# Patient Record
Sex: Female | Born: 1990 | Race: Black or African American | Hispanic: No | Marital: Single | Smoking: Current every day smoker
Health system: Southern US, Community
[De-identification: ages and names within clinical notes are randomized; demographics above are authoritative.]

---
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
## 2010-10-17 LAB — GC/CHLAMYDIA PROBE AMP, GENITAL
Chlamydia, DNA Probe: NEGATIVE
GC Probe Amp, Genital: NEGATIVE

## 2010-10-17 LAB — WET PREP, GENITAL: Clue Cells Wet Prep HPF POC: NONE SEEN

## 2010-11-02 LAB — URINALYSIS, ROUTINE W REFLEX MICROSCOPIC
Leukocytes, UA: NEGATIVE
Nitrite: NEGATIVE
Specific Gravity, Urine: 1.01 (ref 1.005–1.030)
pH: 7.5 (ref 5.0–8.0)

## 2010-11-02 LAB — URINE MICROSCOPIC-ADD ON

## 2010-11-02 LAB — WET PREP, GENITAL
Trich, Wet Prep: NONE SEEN
Yeast Wet Prep HPF POC: NONE SEEN

## 2010-11-02 LAB — CBC
HCT: 30.3 % — ABNORMAL LOW (ref 36.0–46.0)
Platelets: 300 10*3/uL (ref 150–400)
WBC: 7.4 10*3/uL (ref 4.0–10.5)

## 2010-11-02 LAB — POCT PREGNANCY, URINE: Preg Test, Ur: NEGATIVE

## 2016-06-15 ENCOUNTER — Encounter (HOSPITAL_COMMUNITY): Payer: Self-pay | Admitting: Emergency Medicine

## 2016-06-15 ENCOUNTER — Emergency Department (HOSPITAL_COMMUNITY)
Admission: EM | Admit: 2016-06-15 | Discharge: 2016-06-15 | Disposition: A | Discharge status: Home / Self Care | Payer: Self-pay | Attending: Emergency Medicine | Admitting: Emergency Medicine

## 2016-06-15 DIAGNOSIS — F17200 Nicotine dependence, unspecified, uncomplicated: Secondary | ICD-10-CM | POA: Insufficient documentation

## 2016-06-15 DIAGNOSIS — M5441 Lumbago with sciatica, right side: Secondary | ICD-10-CM | POA: Insufficient documentation

## 2016-06-15 DIAGNOSIS — M5431 Sciatica, right side: Secondary | ICD-10-CM

## 2016-06-15 MED ORDER — CYCLOBENZAPRINE HCL 10 MG PO TABS: 10.0000 mg | ORAL_TABLET | Freq: Once | ORAL | Status: DC

## 2016-06-15 MED ORDER — OXYCODONE-ACETAMINOPHEN 5-325 MG PO TABS
1.0000 | ORAL_TABLET | Freq: Once | ORAL | Status: AC
  Administered 2016-06-15: 1 via ORAL
  Filled 2016-06-15: qty 1

## 2016-06-15 MED ORDER — CYCLOBENZAPRINE HCL 10 MG PO TABS: 10.0000 mg | ORAL_TABLET | Freq: Two times a day (BID) | ORAL | 0 refills | Status: AC | PRN

## 2016-06-15 MED ORDER — HYDROCODONE-ACETAMINOPHEN 5-325 MG PO TABS
1.0000 | ORAL_TABLET | Freq: Four times a day (QID) | ORAL | 0 refills | Status: AC | PRN
Start: 2016-06-15 — End: ?

## 2016-06-15 MED ORDER — HYDROCODONE-ACETAMINOPHEN 5-325 MG PO TABS
1.0000 | ORAL_TABLET | Freq: Once | ORAL | Status: DC
Start: 2016-06-15 — End: 2016-06-15

## 2016-06-15 MED ORDER — DICLOFENAC SODIUM 50 MG PO TBEC: 50.0000 mg | DELAYED_RELEASE_TABLET | Freq: Two times a day (BID) | ORAL | 0 refills | Status: AC

## 2016-06-15 MED ORDER — CYCLOBENZAPRINE HCL 10 MG PO TABS
10.0000 mg | ORAL_TABLET | Freq: Once | ORAL | Status: AC
  Administered 2016-06-15: 10 mg via ORAL
  Filled 2016-06-15: qty 1

## 2016-06-15 NOTE — Discharge Instructions (Signed)
Do not drive while taking the narcotic or the muscle relaxant because they will make you sleepy. °

## 2016-06-15 NOTE — ED Provider Notes (Signed)
[facility]-EMERGENCY DEPT Provider Note   CSN: [id number] Arrival date & time: 06/15/16  2004  By signing my name below, I, Morene CrockerKevin Le, attest that this documentation has been prepared under the direction and in the presence of Kerrie BuffaloHope Neese, NP. Electronically Signed: Morene CrockerKevin Le, Scribe. 06/15/16. 9:06 PM.    History   Chief Complaint Chief Complaint  Patient presents with  . Back Pain     HPI Comments: Sarah Mcintosh is a 25 y.o. female who presents to the Emergency Department complaining of recurrent, gradual worsening right lower back pain that worsened this evening at work. Pt states the pain radiates into her right leg and the pain worsens with hip movement. She reports working overtime recently and was moving heavy furniture with the pain flared up. She states these symptoms are similar to previous episodes. She reports taking Tylenol 3 with little relief. She does not see a PCP.  She denies abdominal pain.   The history is provided by the patient. No language interpreter was used.  Back Pain   This is a new problem. The current episode started yesterday. The problem occurs constantly. The problem has been gradually worsening. The pain is associated with lifting heavy objects. The pain is present in the lumbar spine. The quality of the pain is described as aching. The pain is moderate. The symptoms are aggravated by bending. Pertinent negatives include no abdominal pain. She has tried NSAIDs for the symptoms. The treatment provided no relief.    History reviewed. No pertinent past medical history.  There are no active problems to display for this patient.   History reviewed. No pertinent surgical history.  OB History    No data available       Home Medications    Prior to Admission medications   Medication Sig Start Date End Date Taking? Authorizing Provider  cyclobenzaprine (FLEXERIL) 10 MG tablet Take 1 tablet (10 mg total) by mouth 2 (two) times daily as needed for muscle  spasms. 06/15/16   Hope Orlene OchM Neese, NP  diclofenac (VOLTAREN) 50 MG EC tablet Take 1 tablet (50 mg total) by mouth 2 (two) times daily. 06/15/16   Hope Orlene OchM Neese, NP  HYDROcodone-acetaminophen (NORCO/VICODIN) 5-325 MG tablet Take 1 tablet by mouth every 6 (six) hours as needed for severe pain. 06/15/16   Hope Orlene OchM Neese, NP    Family History No family history on file.  Social History Social History  Substance Use Topics  . Smoking status: Current Every Day Smoker  . Smokeless tobacco: Never Used     Comment: one pack a week  . Alcohol use Yes     Comment: occasionally wine     Allergies   Ginger   Review of Systems Review of Systems  Gastrointestinal: Negative for abdominal pain.  Musculoskeletal: Positive for back pain.  all other systems negative   Physical Exam Updated Vital Signs BP 145/79 (BP Location: Left Arm)   Pulse 71   Temp 98.2 F (36.8 C) (Oral)   Resp 18   Ht 5\' 5"  (1.651 m)   Wt 94.6 kg   LMP  (Approximate)   SpO2 100%   BMI 34.71 kg/m   Physical Exam  Constitutional: She is oriented to person, place, and time. She appears well-developed and well-nourished. No distress.  HENT:  Head: Normocephalic and atraumatic.  Right Ear: Tympanic membrane normal.  Left Ear: Tympanic membrane normal.  Mouth/Throat: Uvula is midline and mucous membranes are normal.  Eyes: EOM are normal.  Neck:  Normal range of motion. Neck supple.  Cardiovascular: Normal rate and regular rhythm.   Radial pulse 2+  Pulmonary/Chest: Effort normal and breath sounds normal.  Abdominal: Soft. There is no tenderness.  Musculoskeletal: She exhibits tenderness.       Lumbar back: She exhibits tenderness, pain and spasm. She exhibits normal pulse.  Tenderness over right sciatic nerve radiating to the posterior knee Muscule spasm in right lumbar area  Neurological: She is alert and oriented to person, place, and time. She has normal strength. No cranial nerve deficit or sensory deficit.  Gait normal.  Reflex Scores:      Bicep reflexes are 2+ on the right side and 2+ on the left side.      Brachioradialis reflexes are 2+ on the right side and 2+ on the left side.      Patellar reflexes are 2+ on the right side and 2+ on the left side.      Achilles reflexes are 2+ on the right side and 2+ on the left side. Grips are equal  Skin: Skin is warm and dry.  Psychiatric: She has a normal mood and affect. Her behavior is normal.  Nursing note and vitals reviewed.    ED Treatments / Results  DIAGNOSTIC STUDIES: Oxygen Saturation is 100% on RA, normal by my interpretation.    COORDINATION OF CARE: 9:05 PM Discussed treatment plan with pt at bedside and pt agreed to plan.   Labs (all labs ordered are listed, but only abnormal results are displayed) Labs Reviewed - No data to display   Radiology No results found.  Procedures Procedures (including critical care time)  Medications Ordered in ED Medications  cyclobenzaprine (FLEXERIL) tablet 10 mg (10 mg Oral Given 06/15/16 2105)  oxyCODONE-acetaminophen (PERCOCET/ROXICET) 5-325 MG per tablet 1 tablet (1 tablet Oral Given 06/15/16 2105)     Initial Impression / Assessment and Plan / ED Course  I have reviewed the triage vital signs and the nursing notes.   Clinical Course    Patient with back pain.  No neurological deficits and normal neuro exam.  Patient is ambulatory.  No loss of bowel or bladder control.  No concern for cauda equina.  No fever, night sweats, weight loss, h/o cancer, IVDA, no recent procedure to back. No urinary symptoms suggestive of UTI.  Supportive care and return precaution discussed. Appears safe for discharge at this time. Follow up as indicated in discharge paperwork.   Final Clinical Impressions(s) / ED Diagnoses   Final diagnoses:  Sciatica of right side    New Prescriptions Discharge Medication List as of 06/15/2016  9:17 PM    START taking these medications   Details    cyclobenzaprine (FLEXERIL) 10 MG tablet Take 1 tablet (10 mg total) by mouth 2 (two) times daily as needed for muscle spasms., Starting Sat 06/15/2016, Print    diclofenac (VOLTAREN) 50 MG EC tablet Take 1 tablet (50 mg total) by mouth 2 (two) times daily., Starting Sat 06/15/2016, Print    HYDROcodone-acetaminophen (NORCO/VICODIN) 5-325 MG tablet Take 1 tablet by mouth every 6 (six) hours as needed for severe pain., Starting Sat 06/15/2016, Print       I personally performed the services described in this documentation, which was scribed in my presence. The recorded information has been reviewed and is accurate.     [address]Hope StoningtonM Neese, NP 06/17/16 [id number]    Lavera Guiseana Duo Liu, MD 06/17/16 1019

## 2016-06-15 NOTE — ED Triage Notes (Signed)
Pt reports recurrent back pain beginning this am in lower right back that shoots down to her right knee.  Denies changes in urination or recent injury.  Full sensation, strength, and ROM in both legs.

## 2016-07-24 ENCOUNTER — Emergency Department (HOSPITAL_COMMUNITY)
Admission: EM | Admit: 2016-07-24 | Discharge: 2016-07-24 | Disposition: A | Discharge status: Home / Self Care | Payer: Self-pay | Attending: Emergency Medicine | Admitting: Emergency Medicine

## 2016-07-24 ENCOUNTER — Encounter (HOSPITAL_COMMUNITY): Payer: Self-pay

## 2016-07-24 DIAGNOSIS — Z79899 Other long term (current) drug therapy: Secondary | ICD-10-CM | POA: Insufficient documentation

## 2016-07-24 DIAGNOSIS — J040 Acute laryngitis: Secondary | ICD-10-CM | POA: Insufficient documentation

## 2016-07-24 DIAGNOSIS — F17200 Nicotine dependence, unspecified, uncomplicated: Secondary | ICD-10-CM | POA: Insufficient documentation

## 2016-07-24 DIAGNOSIS — J029 Acute pharyngitis, unspecified: Secondary | ICD-10-CM | POA: Insufficient documentation

## 2016-07-24 LAB — RAPID STREP SCREEN (MED CTR MEBANE ONLY): Streptococcus, Group A Screen (Direct): NEGATIVE

## 2016-07-24 MED ORDER — DEXAMETHASONE SODIUM PHOSPHATE 10 MG/ML IJ SOLN
10.0000 mg | Freq: Once | INTRAMUSCULAR | Status: AC
  Administered 2016-07-24: 10 mg via INTRAMUSCULAR
  Filled 2016-07-24: qty 1

## 2016-07-24 NOTE — Discharge Instructions (Signed)
Ibuprofen or tylenol for pain. Salt water gargles. Follow up with primary care doctor if not improving.

## 2016-07-24 NOTE — ED Triage Notes (Signed)
Pt with sore throat since Friday.  Unknown for fever d/t taking meds for pain.  Pt states swelling in lymph nodes and headache.

## 2016-07-24 NOTE — ED Provider Notes (Signed)
[facility]-EMERGENCY DEPT Provider Note   CSN: [id number] Arrival date & time: 07/24/16  1556   By signing my name below, I, Cynda AcresHailei Fulton, attest that this documentation has been prepared under the direction and in the presence of Jaynie Crumbleatyana Doralene Glanz, PA-C Electronically Signed: Cynda AcresHailei Fulton, Scribe. 07/24/16. 4:20 PM.  History   Chief Complaint Chief Complaint  Patient presents with  . Sore Throat    HPI Comments: Sarah Mcintosh is a 25 y.o. female with no apparent PMHx who presents to the Emergency Department complaining of gradual onset, constant sore throat that began 6 days ago. She has associated neck stiffness and swollen tonsils. She describes the pain as sharp. She reports taking ibuprofen with no pain relief. Patient reports having oral sex last week, but states they both just got tested for STI and both negative. She denies any fever, ear pain, and nasal congestion. States voice is horse.   The history is provided by the patient. No language interpreter was used.    History reviewed. No pertinent past medical history.  There are no active problems to display for this patient.   History reviewed. No pertinent surgical history.  OB History    No data available       Home Medications    Prior to Admission medications   Medication Sig Start Date End Date Taking? Authorizing Provider  cyclobenzaprine (FLEXERIL) 10 MG tablet Take 1 tablet (10 mg total) by mouth 2 (two) times daily as needed for muscle spasms. 06/15/16   Hope Orlene OchM Neese, NP  diclofenac (VOLTAREN) 50 MG EC tablet Take 1 tablet (50 mg total) by mouth 2 (two) times daily. 06/15/16   Hope Orlene OchM Neese, NP  HYDROcodone-acetaminophen (NORCO/VICODIN) 5-325 MG tablet Take 1 tablet by mouth every 6 (six) hours as needed for severe pain. 06/15/16   Hope Orlene OchM Neese, NP    Family History History reviewed. No pertinent family history.  Social History Social History  Substance Use Topics  . Smoking status: Current Every Day  Smoker  . Smokeless tobacco: Never Used     Comment: one pack a week  . Alcohol use Yes     Comment: occasionally wine     Allergies   Ginger   Review of Systems Review of Systems  Constitutional: Negative for fever.  HENT: Positive for sore throat and voice change. Negative for congestion, ear pain and trouble swallowing.   Respiratory: Negative for cough.   Gastrointestinal: Negative for abdominal pain.  Musculoskeletal: Positive for myalgias.  Neurological: Positive for headaches.     Physical Exam Updated Vital Signs BP 137/75 (BP Location: Left Arm)   Pulse 65   Temp 97.5 F (36.4 C) (Oral)   Resp 18   Ht 5\' 5"  (1.651 m)   Wt 193 lb (87.5 kg)   LMP 07/03/2016   SpO2 100%   BMI 32.12 kg/m   Physical Exam  Constitutional: She appears well-developed and well-nourished. No distress.  HENT:  Head: Normocephalic.  Right Ear: External ear normal.  Left Ear: External ear normal.  Ear canals and TMs look normal with some TM scarring. Oropharynx erythematous, tonsils symmetrically enlarged, uvula midline. No exudate.   Eyes: Conjunctivae are normal.  Neck: Neck supple.  Cardiovascular: Normal rate, regular rhythm and normal heart sounds.   Pulmonary/Chest: Effort normal and breath sounds normal. No respiratory distress. She has no wheezes. She has no rales.  Abdominal: Soft. Bowel sounds are normal. She exhibits no distension. There is no tenderness. There is no rebound.  Musculoskeletal: She exhibits no edema.  Lymphadenopathy:    She has no cervical adenopathy.  Neurological: She is alert.  Skin: Skin is warm and dry.  Psychiatric: She has a normal mood and affect. Her behavior is normal.  Nursing note and vitals reviewed.    ED Treatments / Results  DIAGNOSTIC STUDIES: Oxygen Saturation is 100% on RA, normal by my interpretation.    COORDINATION OF CARE: 4:25 PM Discussed treatment plan with pt at bedside and pt agreed to plan.  Labs (all labs ordered  are listed, but only abnormal results are displayed) Labs Reviewed  RAPID STREP SCREEN (NOT AT [facility])  CULTURE, GROUP A STREP [facility])    EKG  EKG Interpretation None       Radiology No results found.  Procedures Procedures (including critical care time)  Medications Ordered in ED Medications - No data to display   Initial Impression / Assessment and Plan / ED Course  I have reviewed the triage vital signs and the nursing notes.  Pertinent labs & imaging results that were available during my care of the patient were reviewed by me and considered in my medical decision making (see chart for details).  Clinical Course     Pt in ED with sore throat and horse voice. VS normal. Afebrile. Non toxic appearing. No PE findings to suggest peritonsillar or retropharyngeal abscess. Rapid strep obtained and negative. Most likely viral pharyngitis. WIll treat symptomatically, salt water gargles, ibuprofen, tylenol, follow up with pcp.   Vitals:   07/24/16 1601 07/24/16 1602 07/24/16 1612  BP: (!) 128/117  137/75  Pulse: 65    Resp: 18    Temp: 97.5 F (36.4 C)    TempSrc: Oral    SpO2: 100%    Weight:  87.5 kg   Height:  5\' 5"  (1.651 m)      Final Clinical Impressions(s) / ED Diagnoses   Final diagnoses:  Acute pharyngitis, unspecified etiology  Laryngitis    New Prescriptions Discharge Medication List as of 07/24/2016  5:25 PM     Vitals:   07/24/16 1601 07/24/16 1602 07/24/16 1612  BP: (!) 128/117  137/75  Pulse: 65    Resp: 18    Temp: 97.5 F (36.4 C)    TempSrc: Oral    SpO2: 100%    Weight:  87.5 kg   Height:  5\' 5"  (1.651 m)       Jaynie Crumbleatyana Damali Broadfoot, PA-C 07/24/16 1737    Cathren LaineKevin Steinl, MD 07/24/16 2041

## 2016-07-25 LAB — CULTURE, GROUP A STREP (THRC)

## 2016-07-26 ENCOUNTER — Encounter (HOSPITAL_COMMUNITY): Payer: Self-pay | Admitting: Emergency Medicine

## 2016-07-26 ENCOUNTER — Emergency Department (HOSPITAL_COMMUNITY)
Admission: EM | Admit: 2016-07-26 | Discharge: 2016-07-26 | Disposition: A | Discharge status: Home / Self Care | Payer: Medicaid Other | Attending: Emergency Medicine | Admitting: Emergency Medicine

## 2016-07-26 DIAGNOSIS — F17200 Nicotine dependence, unspecified, uncomplicated: Secondary | ICD-10-CM | POA: Insufficient documentation

## 2016-07-26 DIAGNOSIS — J020 Streptococcal pharyngitis: Secondary | ICD-10-CM | POA: Insufficient documentation

## 2016-07-26 MED ORDER — PENICILLIN G BENZATHINE & PROC 900000-300000 UNIT/2ML IM SUSP
1.2000 10*6.[IU] | Freq: Once | INTRAMUSCULAR | Status: AC
  Administered 2016-07-26: 1.2 10*6.[IU] via INTRAMUSCULAR
  Filled 2016-07-26: qty 2

## 2016-07-26 MED ORDER — ONDANSETRON 4 MG PO TBDP
4.0000 mg | ORAL_TABLET | Freq: Once | ORAL | Status: AC
  Administered 2016-07-26: 4 mg via ORAL
  Filled 2016-07-26: qty 1

## 2016-07-26 MED ORDER — ONDANSETRON HCL 4 MG PO TABS: 4.0000 mg | ORAL_TABLET | Freq: Four times a day (QID) | ORAL | 0 refills | Status: AC

## 2016-07-26 NOTE — ED Notes (Signed)
Patient was alert, oriented and stable upon discharge. RN went over AVS and patient had no further questions.  

## 2016-07-26 NOTE — Discharge Instructions (Signed)
You are infectious for 24 hours after taking your first dose. Drink plenty of fluids Take Tylenol or Ibuprofen for pain/fever as needed

## 2016-07-26 NOTE — ED Provider Notes (Signed)
[facility]-EMERGENCY DEPT Provider Note   CSN: [id number] Arrival date & time: 07/26/16  1445   By signing my name below, I, Valentino SaxonBianca Contreras, attest that this documentation has been prepared under the direction and in the presence of Terance HartKelly Bralynn Velador, PA-C Electronically Signed: Valentino SaxonBianca Contreras, ED Scribe. 07/26/16. 3:18 PM.  History   Chief Complaint Chief Complaint  Patient presents with  . Sore Throat   The history is provided by the patient. No language interpreter was used.   HPI Comments: Sarah Mcintosh is a [age] y.o. female with no apparent PMHx, who presents to the Emergency Department complaining of moderate, constant, sore throat onset two weeks ago. Pt reports associated gradually worsening nasal congestion accompanied by bilateral ear pain. She states having mild, post nasal drip. Pt also reports cough, nausea, vomiting, HA and "jitters". She notes coughing up some mucous. Pt describes it as a "yellowish- greenish" color. Pt is able to tolerate fluids without difficulty. She states taking tylenol with minimal relief. Pt was last seen in ED on 12/27 for same symptoms and had a rapid strep however patient's culture was positive for abundant strep, beta hemolytic, but not group A. Denies fever, drooling, stridor, SOB, abdominal pain. She states she received a steroid shot and that helped her throat swelling but not the pain.  History reviewed. No pertinent past medical history.  There are no active problems to display for this patient.   History reviewed. No pertinent surgical history.  OB History    No data available       Home Medications    Prior to Admission medications   Medication Sig Start Date End Date Taking? Authorizing Provider  cyclobenzaprine (FLEXERIL) 10 MG tablet Take 1 tablet (10 mg total) by mouth 2 (two) times daily as needed for muscle spasms. 06/15/16   Hope Orlene OchM Neese, NP  diclofenac (VOLTAREN) 50 MG EC tablet Take 1 tablet (50 mg total) by mouth 2 (two) times  daily. 06/15/16   Hope Orlene OchM Neese, NP  HYDROcodone-acetaminophen (NORCO/VICODIN) 5-325 MG tablet Take 1 tablet by mouth every 6 (six) hours as needed for severe pain. 06/15/16   Hope Orlene OchM Neese, NP    Family History No family history on file.  Social History Social History  Substance Use Topics  . Smoking status: Current Every Day Smoker  . Smokeless tobacco: Never Used     Comment: one pack a week  . Alcohol use Yes     Comment: occasionally wine     Allergies   Ginger   Review of Systems Review of Systems  Constitutional: Negative for fever.  HENT: Positive for congestion, ear pain, postnasal drip and sore throat. Negative for drooling.   Respiratory: Positive for cough. Negative for shortness of breath.   Gastrointestinal: Positive for nausea and vomiting. Negative for abdominal pain.  Neurological: Positive for headaches.     Physical Exam Updated Vital Signs BP 136/89 (BP Location: Right Arm)   Pulse 64   Temp 98.2 F (36.8 C) (Oral)   Resp 16   LMP 07/03/2016   SpO2 100%   Physical Exam  Constitutional: She appears well-developed and well-nourished.  HENT:  Head: Normocephalic and atraumatic.  Right Ear: Hearing, tympanic membrane, external ear and ear canal normal.  Left Ear: Hearing, external ear and ear canal normal.  Nose: Nose normal.  Mouth/Throat: No oropharyngeal exudate.  Scarring of left TM.  Eyes: Conjunctivae are normal. Right eye exhibits no discharge. Left eye exhibits no discharge.  Neck:  Mild  erythema, no exudate or abscess.   Cardiovascular: Normal rate, regular rhythm and normal heart sounds.  Exam reveals no gallop and no friction rub.   No murmur heard. Pulmonary/Chest: Effort normal and breath sounds normal. No respiratory distress. She has no wheezes. She has no rales.  Abdominal: There is no tenderness.  Neurological: She is alert. Coordination normal.  Skin: Skin is warm and dry. No rash noted. She is not diaphoretic. No erythema.    Psychiatric: She has a normal mood and affect.  Nursing note and vitals reviewed.    ED Treatments / Results   DIAGNOSTIC STUDIES: Oxygen Saturation is 100% on RA, normal by my interpretation.    COORDINATION OF CARE: 3:17 PM Discussed treatment plan with pt at bedside which includes antibiotic injection and pt agreed to plan.   Labs (all labs ordered are listed, but only abnormal results are displayed) Labs Reviewed - No data to display  EKG  EKG Interpretation None       Radiology No results found.  Procedures Procedures (including critical care time)  Medications Ordered in ED Medications  penicillin g benzathine-penicillin g procaine (BICILLIN-CR) injection 900000-300000 units (1.2 Million Units Intramuscular Given 07/26/16 1524)  ondansetron (ZOFRAN-ODT) disintegrating tablet 4 mg (4 mg Oral Given 07/26/16 1523)     Initial Impression / Assessment and Plan / ED Course  I have reviewed the triage vital signs and the nursing notes.  Pertinent labs & imaging results that were available during my care of the patient were reviewed by me and considered in my medical decision making (see chart for details).  Clinical Course    25 year old female with culture positive for strep which is not GAS. Patient is afebrile, not tachycardic or tachypneic, and not hypoxic. She is mildly hypertensive. Exam not concerning for PTA, Ludwig's, or any deep space infection. IM PCN given along with Zofran. Will d/c and advised return for worsening symptoms.  Final Clinical Impressions(s) / ED Diagnoses   Final diagnoses:  Strep pharyngitis    New Prescriptions New Prescriptions   No medications on file   I personally performed the services described in this documentation, which was scribed in my presence. The recorded information has been reviewed and is accurate.     Bethel BornKelly Marie Chavie Kolinski, PA-C 07/26/16 1737    Doug SouSam Jacubowitz, MD 07/27/16 Marlyne [id number]

## 2016-07-26 NOTE — ED Triage Notes (Signed)
Pt complaint of continued sore throat with new/worsening nasal congestion and ear pain; seen 2 days ago for same.

## 2017-11-26 ENCOUNTER — Emergency Department (HOSPITAL_COMMUNITY)
Admission: EM | Admit: 2017-11-26 | Discharge: 2017-11-26 | Disposition: A | Discharge status: Home / Self Care | Payer: Self-pay | Attending: Emergency Medicine | Admitting: Emergency Medicine

## 2017-11-26 ENCOUNTER — Emergency Department (HOSPITAL_COMMUNITY): Payer: Self-pay

## 2017-11-26 ENCOUNTER — Encounter (HOSPITAL_COMMUNITY): Payer: Self-pay

## 2017-11-26 DIAGNOSIS — N888 Other specified noninflammatory disorders of cervix uteri: Secondary | ICD-10-CM | POA: Insufficient documentation

## 2017-11-26 DIAGNOSIS — N939 Abnormal uterine and vaginal bleeding, unspecified: Secondary | ICD-10-CM | POA: Insufficient documentation

## 2017-11-26 DIAGNOSIS — F17200 Nicotine dependence, unspecified, uncomplicated: Secondary | ICD-10-CM | POA: Insufficient documentation

## 2017-11-26 DIAGNOSIS — N760 Acute vaginitis: Secondary | ICD-10-CM | POA: Insufficient documentation

## 2017-11-26 DIAGNOSIS — B9689 Other specified bacterial agents as the cause of diseases classified elsewhere: Secondary | ICD-10-CM | POA: Insufficient documentation

## 2017-11-26 LAB — URINALYSIS, ROUTINE W REFLEX MICROSCOPIC
BILIRUBIN URINE: NEGATIVE
Glucose, UA: NEGATIVE mg/dL
Ketones, ur: 5 mg/dL — AB
LEUKOCYTES UA: NEGATIVE
Nitrite: NEGATIVE
Protein, ur: 30 mg/dL — AB
SPECIFIC GRAVITY, URINE: 1.032 — AB (ref 1.005–1.030)
pH: 5 (ref 5.0–8.0)

## 2017-11-26 LAB — COMPREHENSIVE METABOLIC PANEL
ALBUMIN: 3.6 g/dL (ref 3.5–5.0)
ALK PHOS: 74 U/L (ref 38–126)
ALT: 14 U/L (ref 14–54)
AST: 18 U/L (ref 15–41)
Anion gap: 6 (ref 5–15)
BUN: 12 mg/dL (ref 6–20)
CALCIUM: 8.8 mg/dL — AB (ref 8.9–10.3)
CO2: 23 mmol/L (ref 22–32)
CREATININE: 0.75 mg/dL (ref 0.44–1.00)
Chloride: 112 mmol/L — ABNORMAL HIGH (ref 101–111)
GFR calc Af Amer: >60 mL/min (ref >60)
GLUCOSE: 87 mg/dL (ref 65–99)
Potassium: 4.2 mmol/L (ref 3.5–5.1)
Sodium: 141 mmol/L (ref 135–145)
TOTAL PROTEIN: 7.5 g/dL (ref 6.5–8.1)
Total Bilirubin: 0.3 mg/dL (ref 0.3–1.2)

## 2017-11-26 LAB — WET PREP, GENITAL
Sperm: NONE SEEN
Trich, Wet Prep: NONE SEEN
YEAST WET PREP: NONE SEEN

## 2017-11-26 LAB — CBC
HEMATOCRIT: 36.4 % (ref 36.0–46.0)
Hemoglobin: 11.6 g/dL — ABNORMAL LOW (ref 12.0–15.0)
MCH: 27 pg (ref 26.0–34.0)
MCHC: 31.9 g/dL (ref 30.0–36.0)
MCV: 84.8 fL (ref 78.0–100.0)
PLATELETS: 279 10*3/uL (ref 150–400)
RBC: 4.29 MIL/uL (ref 3.87–5.11)
RDW: 15.8 % — AB (ref 11.5–15.5)
WBC: 7 10*3/uL (ref 4.0–10.5)

## 2017-11-26 LAB — I-STAT BETA HCG BLOOD, ED (MC, WL, AP ONLY): I-stat hCG, quantitative: <5 m[IU]/mL (ref <5)

## 2017-11-26 LAB — LIPASE, BLOOD: Lipase: 38 U/L (ref 11–51)

## 2017-11-26 MED ORDER — METRONIDAZOLE 500 MG PO TABS: 500.0000 mg | ORAL_TABLET | Freq: Two times a day (BID) | ORAL | 0 refills | Status: AC

## 2017-11-26 NOTE — ED Notes (Signed)
Patient aware that we need a urine sample. 

## 2017-11-26 NOTE — ED Provider Notes (Signed)
[facility]-EMERGENCY DEPT Provider Note   CSN: [id number] Arrival date & time: 11/26/17  0028     History   Chief Complaint Chief Complaint  Patient presents with  . Abdominal Pain  . Vaginal Bleeding    HPI Sarah Mcintosh is a 27 y.o. female.  Patient presents to the emergency department with a chief complaint of lower abdominal cramping and vaginal bleeding.  She states that her last menstrual cycle was in February.  She states that she is typically regular.  She is concerned that she is pregnant.  She has not taken anything for her symptoms.  She reports some associated nausea and vomiting, but denies any diarrhea.  The history is provided by the patient. No language interpreter was used.    History reviewed. No pertinent past medical history.  There are no active problems to display for this patient.   History reviewed. No pertinent surgical history.   OB History   None      Home Medications    Prior to Admission medications   Medication Sig Start Date End Date Taking? Authorizing Provider  Flaxseed, Linseed, (FLAXSEED OIL PO) Take 1 capsule by mouth daily.   Yes [provider]  Multiple Vitamin (MULTIVITAMIN WITH MINERALS) TABS tablet Take 1 tablet by mouth every other day.   Yes [provider]  cyclobenzaprine (FLEXERIL) 10 MG tablet Take 1 tablet (10 mg total) by mouth 2 (two) times daily as needed for muscle spasms. Patient not taking: Reported on 11/26/2017 06/15/16   Janne Napoleon, NP  diclofenac (VOLTAREN) 50 MG EC tablet Take 1 tablet (50 mg total) by mouth 2 (two) times daily. Patient not taking: Reported on 11/26/2017 06/15/16   Janne Napoleon, NP  HYDROcodone-acetaminophen (NORCO/VICODIN) 5-325 MG tablet Take 1 tablet by mouth every 6 (six) hours as needed for severe pain. Patient not taking: Reported on 11/26/2017 06/15/16   Janne Napoleon, NP  ondansetron (ZOFRAN) 4 MG tablet Take 1 tablet (4 mg total) by mouth every 6  (six) hours. Patient not taking: Reported on 11/26/2017 07/26/16   Bethel Born, PA-C    Family History History reviewed. No pertinent family history.  Social History Social History   Tobacco Use  . Smoking status: Current Every Day Smoker  . Smokeless tobacco: Never Used  . Tobacco comment: one pack a week  Substance Use Topics  . Alcohol use: Yes    Comment: occasionally wine  . Drug use: No     Allergies   Ginger   Review of Systems Review of Systems  All other systems reviewed and are negative.    Physical Exam Updated Vital Signs BP 131/82 (BP Location: Left Arm)   Pulse 62   Temp 98.6 F (37 C) (Oral)   Resp 14   Ht  (1.651 m)   Wt 95.9 kg (211 lb 6.4 oz)   LMP 08/29/2017   SpO2 100%   BMI 35.18 kg/m   Physical Exam  Constitutional: She is oriented to person, place, and time. She appears well-developed and well-nourished.  HENT:  Head: Normocephalic and atraumatic.  Eyes: Pupils are equal, round, and reactive to light. Conjunctivae and EOM are normal.  Neck: Normal range of motion. Neck supple.  Cardiovascular: Normal rate and regular rhythm. Exam reveals no gallop and no friction rub.  No murmur heard. Pulmonary/Chest: Effort normal and breath sounds normal. No respiratory distress. She has no wheezes. She has no rales. She exhibits no tenderness.  Abdominal: Soft. Bowel sounds are normal. She exhibits no distension and no mass. There is no tenderness. There is no rebound and no guarding.  Musculoskeletal: Normal range of motion. She exhibits no edema or tenderness.  Neurological: She is alert and oriented to person, place, and time.  Skin: Skin is warm and dry.  Psychiatric: She has a normal mood and affect. Her behavior is normal. Judgment and thought content normal.  Nursing note and vitals reviewed.    ED Treatments / Results  Labs (all labs ordered are listed, but only abnormal results are displayed) Labs Reviewed  WET PREP,  GENITAL - Abnormal; Notable for the following components:      Result Value   Clue Cells Wet Prep HPF POC PRESENT (*)    WBC, Wet Prep HPF POC FEW (*)    All other components within normal limits  COMPREHENSIVE METABOLIC PANEL - Abnormal; Notable for the following components:   Chloride 112 (*)    Calcium 8.8 (*)    All other components within normal limits  CBC - Abnormal; Notable for the following components:   Hemoglobin 11.6 (*)    RDW 15.8 (*)    All other components within normal limits  URINALYSIS, ROUTINE W REFLEX MICROSCOPIC - Abnormal; Notable for the following components:   APPearance CLOUDY (*)    Specific Gravity, Urine 1.032 (*)    Hgb urine dipstick LARGE (*)    Ketones, ur 5 (*)    Protein, ur 30 (*)    Bacteria, UA RARE (*)    All other components within normal limits  LIPASE, BLOOD  I-STAT BETA HCG BLOOD, ED ([facility], [facility], [facility] ONLY)  GC/CHLAMYDIA PROBE AMP ([place]) NOT AT [facility]    EKG None  Radiology US Transvaginal Non-ob  Result Date: 11/26/2017 CLINICAL DATA:  Pelvic pain and vaginal bleeding. Left adnexal pain. Irregular periods. EXAM: TRANSABDOMINAL AND TRANSVAGINAL ULTRASOUND OF PELVIS DOPPLER ULTRASOUND OF OVARIES TECHNIQUE: Both transabdominal and transvaginal ultrasound examinations of the pelvis were performed. Transabdominal technique was performed for global imaging of the pelvis including uterus, ovaries, adnexal regions, and pelvic cul-de-sac. It was necessary to proceed with endovaginal exam following the transabdominal exam to visualize the ovaries and endometrium. Color and duplex Doppler ultrasound was utilized to evaluate blood flow to the ovaries. COMPARISON:  04/27/2009 FINDINGS: Uterus Measurements: 7.4 x 3.1 x 4.4 cm. Uterus is anteverted. 1.6 cm diameter nabothian cyst in the cervix. No myometrial masses. Endometrium Thickness: 2.5 mm.  No focal abnormality visualized. Right ovary Measurements: 3.8 x 2.9 x 3.1 cm. There is a benign-appearing  paraovarian cyst at the right ovary measuring 2 cm maximal diameter. No solid adnexal masses. Left ovary Measurements: 3.7 x 1.9 x 2.4 cm. Normal appearance/no adnexal mass. Pulsed Doppler evaluation of both ovaries demonstrates normal low-resistance arterial and venous waveforms. Other findings No abnormal free fluid. IMPRESSION: Normal appearance of the uterus. Large nabothian cysts in the cervix. No evidence of ovarian mass or torsion. Benign-appearing paraovarian cyst along the right ovary. Electronically Signed   By: Burman Nieves M.D.   On: 11/26/2017 05:20   US Pelvis Complete  Result Date: 11/26/2017 CLINICAL DATA:  Pelvic pain and vaginal bleeding. Left adnexal pain. Irregular periods. EXAM: TRANSABDOMINAL AND TRANSVAGINAL ULTRASOUND OF PELVIS DOPPLER ULTRASOUND OF OVARIES TECHNIQUE: Both transabdominal and transvaginal ultrasound examinations of the pelvis were performed. Transabdominal technique was performed for global imaging of the pelvis including uterus, ovaries, adnexal regions, and pelvic cul-de-sac. It was necessary to proceed with  endovaginal exam following the transabdominal exam to visualize the ovaries and endometrium. Color and duplex Doppler ultrasound was utilized to evaluate blood flow to the ovaries. COMPARISON:  04/27/2009 FINDINGS: Uterus Measurements: 7.4 x 3.1 x 4.4 cm. Uterus is anteverted. 1.6 cm diameter nabothian cyst in the cervix. No myometrial masses. Endometrium Thickness: 2.5 mm.  No focal abnormality visualized. Right ovary Measurements: 3.8 x 2.9 x 3.1 cm. There is a benign-appearing paraovarian cyst at the right ovary measuring 2 cm maximal diameter. No solid adnexal masses. Left ovary Measurements: 3.7 x 1.9 x 2.4 cm. Normal appearance/no adnexal mass. Pulsed Doppler evaluation of both ovaries demonstrates normal low-resistance arterial and venous waveforms. Other findings No abnormal free fluid. IMPRESSION: Normal appearance of the uterus. Large nabothian cysts in  the cervix. No evidence of ovarian mass or torsion. Benign-appearing paraovarian cyst along the right ovary. Electronically Signed   By: Burman Nieves M.D.   On: 11/26/2017 05:20   [place] Art/ven Flow Abd Pelv Doppler  Result Date: 11/26/2017 CLINICAL DATA:  Pelvic pain and vaginal bleeding. Left adnexal pain. Irregular periods. EXAM: TRANSABDOMINAL AND TRANSVAGINAL ULTRASOUND OF PELVIS DOPPLER ULTRASOUND OF OVARIES TECHNIQUE: Both transabdominal and transvaginal ultrasound examinations of the pelvis were performed. Transabdominal technique was performed for global imaging of the pelvis including uterus, ovaries, adnexal regions, and pelvic cul-de-sac. It was necessary to proceed with endovaginal exam following the transabdominal exam to visualize the ovaries and endometrium. Color and duplex Doppler ultrasound was utilized to evaluate blood flow to the ovaries. COMPARISON:  04/27/2009 FINDINGS: Uterus Measurements: 7.4 x 3.1 x 4.4 cm. Uterus is anteverted. 1.6 cm diameter nabothian cyst in the cervix. No myometrial masses. Endometrium Thickness: 2.5 mm.  No focal abnormality visualized. Right ovary Measurements: 3.8 x 2.9 x 3.1 cm. There is a benign-appearing paraovarian cyst at the right ovary measuring 2 cm maximal diameter. No solid adnexal masses. Left ovary Measurements: 3.7 x 1.9 x 2.4 cm. Normal appearance/no adnexal mass. Pulsed Doppler evaluation of both ovaries demonstrates normal low-resistance arterial and venous waveforms. Other findings No abnormal free fluid. IMPRESSION: Normal appearance of the uterus. Large nabothian cysts in the cervix. No evidence of ovarian mass or torsion. Benign-appearing paraovarian cyst along the right ovary. Electronically Signed   By: Burman Nieves M.D.   On: 11/26/2017 05:20    Procedures Procedures (including critical care time)  Medications Ordered in ED Medications - No data to display   Initial Impression / Assessment and Plan / ED Course  I have  reviewed the triage vital signs and the nursing notes.  Pertinent labs & imaging results that were available during my care of the patient were reviewed by me and considered in my medical decision making (see chart for details).     Patient with vaginal bleeding and lower abdominal cramping.  She states the symptoms started over the past few days.  Denies any fever.  She is concerned that she is pregnant, I reassured her that she has not.  hCG is negative.  She did have a small amount of blood in the vaginal vault, but no evidence of vaginal trauma or hemorrhage.  Vital signs are stable.  Laboratory work-up is reassuring.  Ultrasound ordered due to left adnexal pain.  Incidental findings include paraovarian cyst and nabothian cyst.  Otherwise, ultrasound is normal.  Recommend follow-up with OB/GYN.  Patient understands and agrees to plan.  She is stable and ready for discharge.  Final Clinical Impressions(s) / ED Diagnoses   Final diagnoses:  Abnormal uterine bleeding  Nabothian cyst  BV (bacterial vaginosis)    ED Discharge Orders    None       Roxy Horseman, PA-C 11/26/17 0535    Devoria Albe, MD 11/26/17 [phone]

## 2017-11-26 NOTE — Discharge Instructions (Signed)
Please follow-up with and OBGYN to further assess your abnormal uterine bleeding.  No emergent/dangerous causes were found tonight.  Incidentally, you were found to have bacterial vaginosis, which is a bacterial infection.  Not an STD.  Treated with antibiotics.  You were also found to have a benign cyst on your cervix and near your ovary.  You can discuss these findings with the OBGYN, but generally, no further management is needed.

## 2017-11-27 LAB — GC/CHLAMYDIA PROBE AMP (~~LOC~~) NOT AT ARMC
Chlamydia: NEGATIVE
NEISSERIA GONORRHEA: NEGATIVE

## 2018-06-25 IMAGING — US US PELVIS COMPLETE
1 series · 13 of 25 positions shown · non-contrast
Comparison: 04/27/2009

CLINICAL DATA: Pelvic pain and vaginal bleeding. Left adnexal pain.
Irregular periods.



[Series 1: us pelvis complete · 0.18mm/px · 13 of 81 slices shown]
[im 1/81]
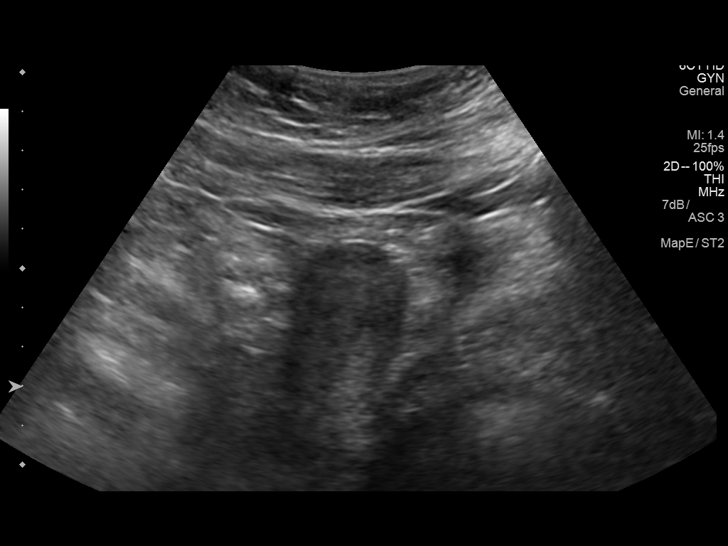
[im 7/81]
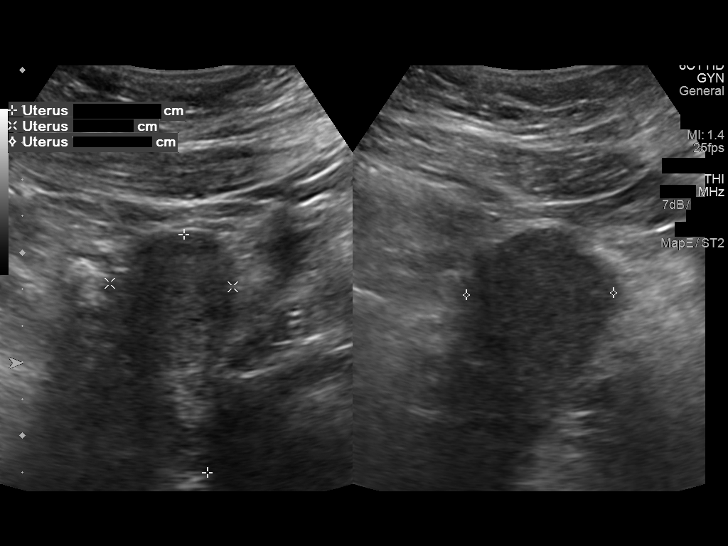
[im 14/81]
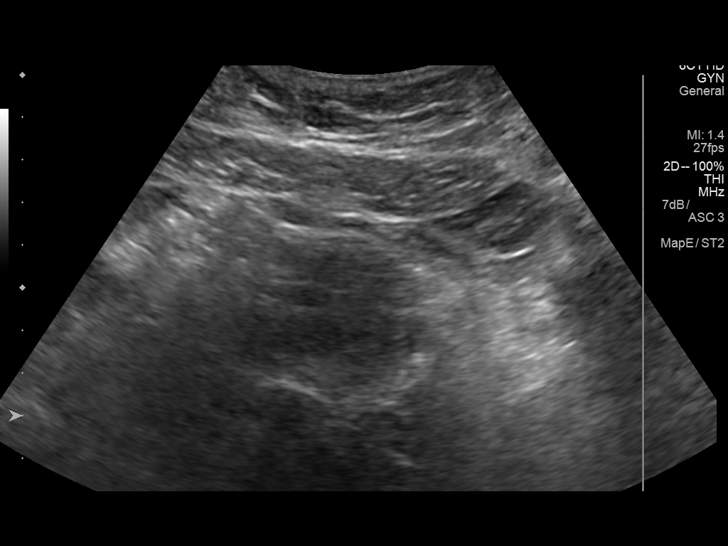
[im 21/81]
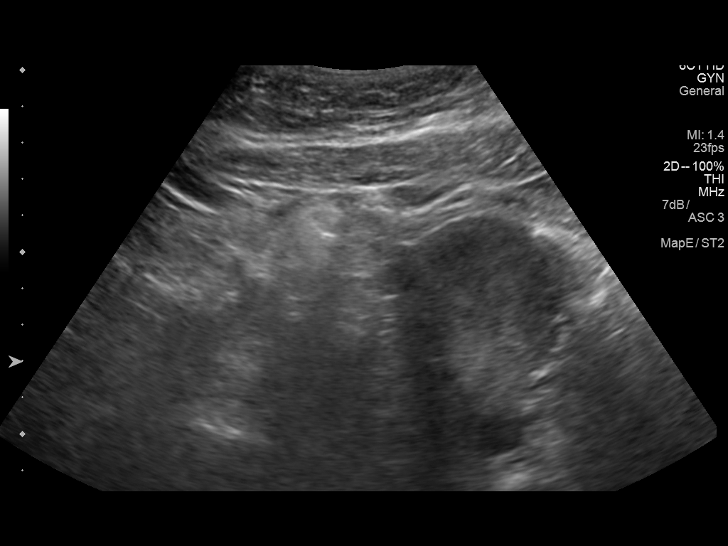
[im 27/81]
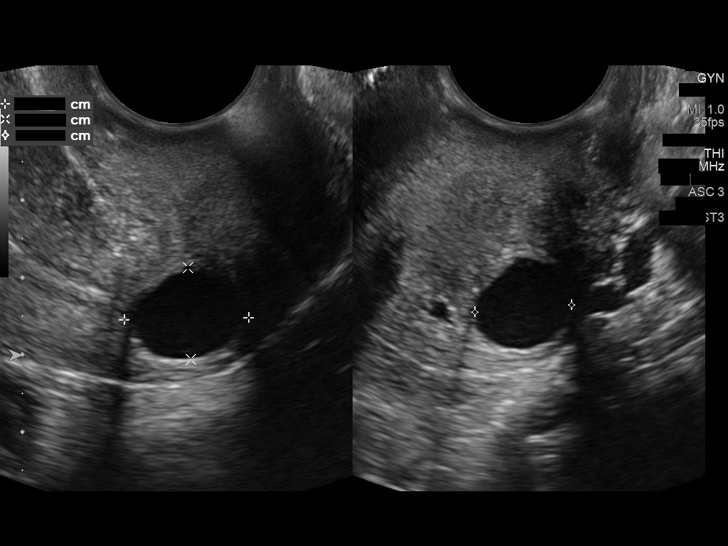
[im 34/81]
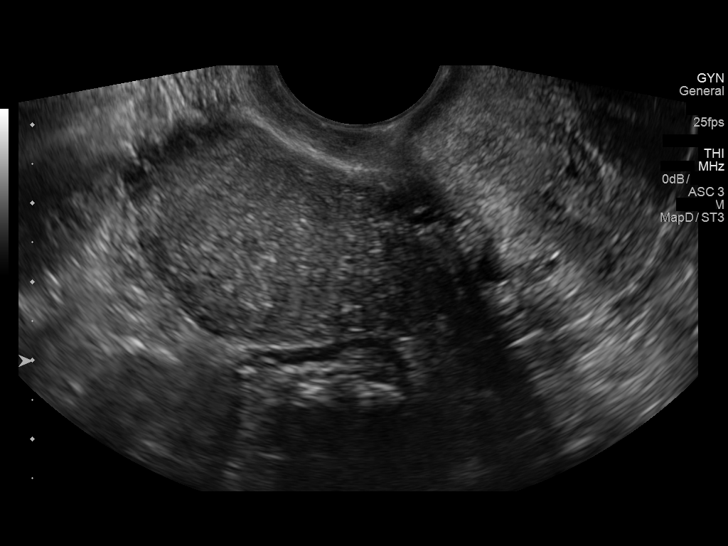
[im 41/81]
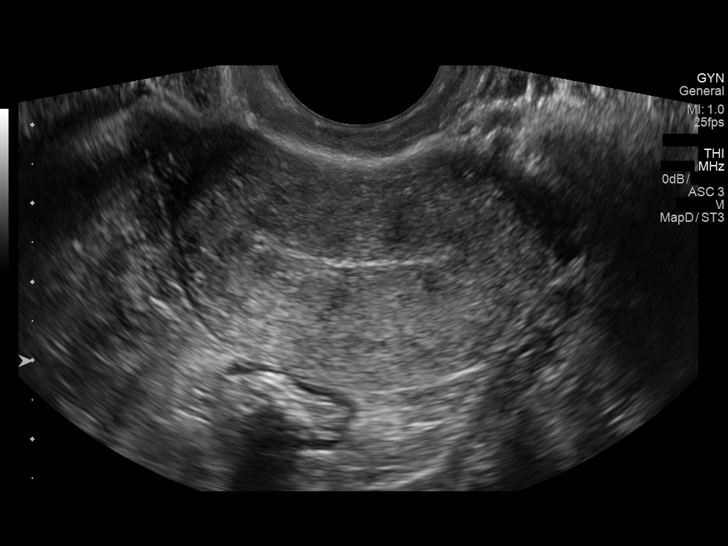
[im 47/81]
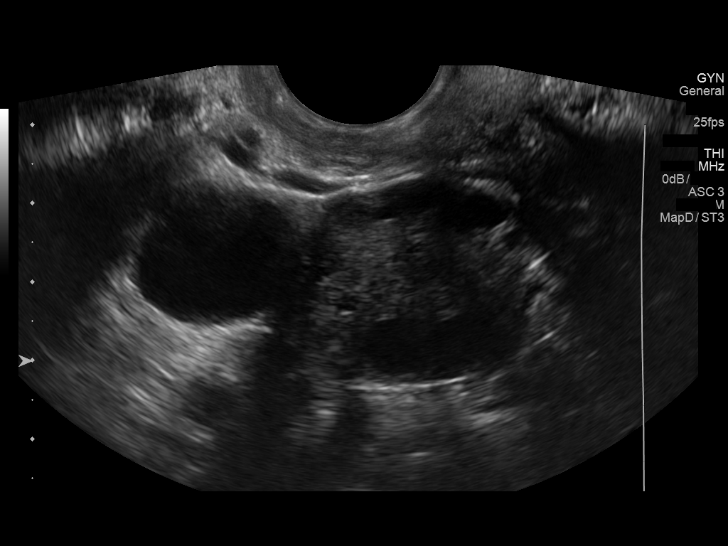
[im 54/81]
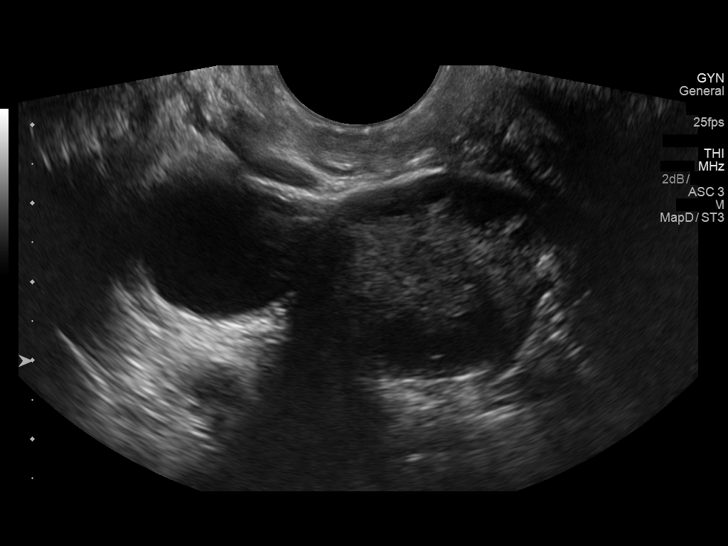
[im 61/81]
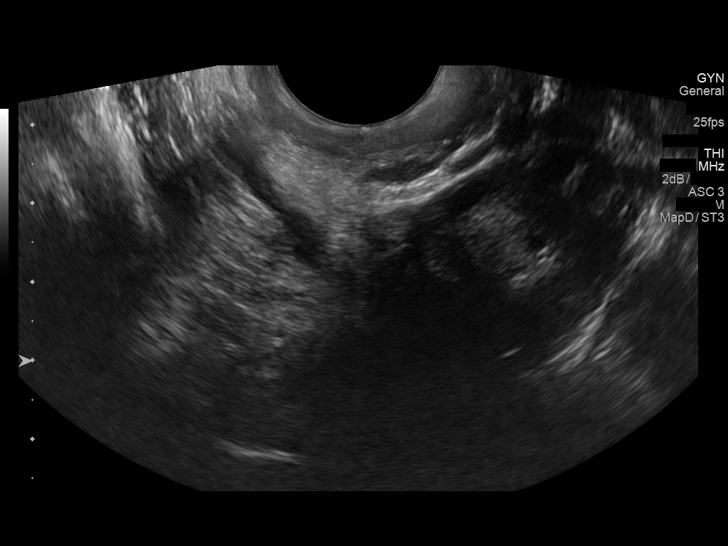
[im 67/81]
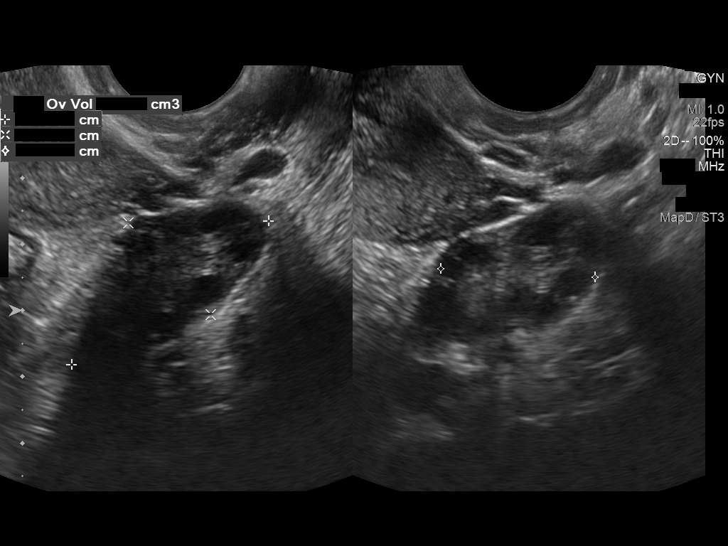
[im 74/81]
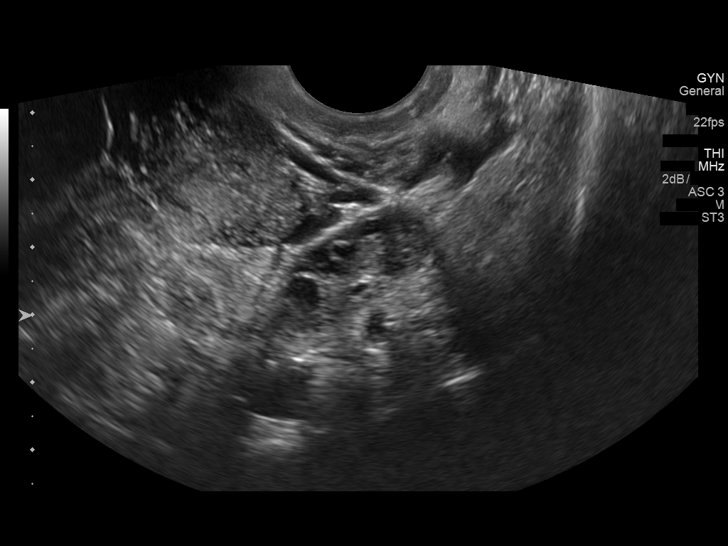
[im 81/81]
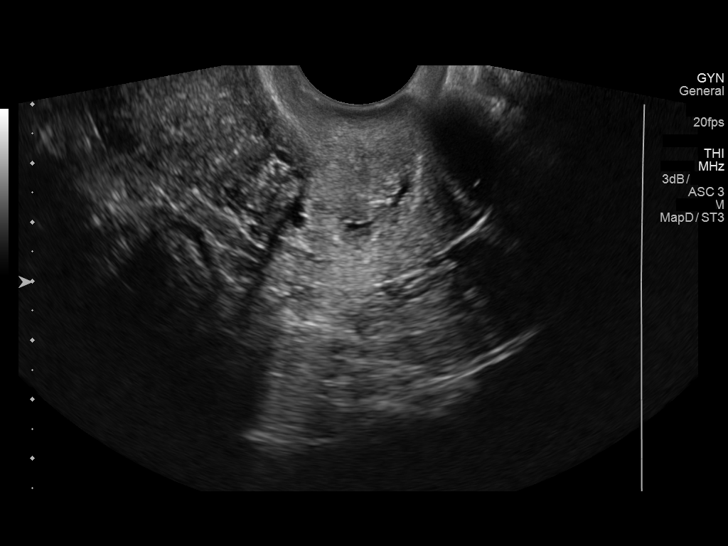

[13 of 25 positions shown; findings below may reference images not displayed]

FINDINGS: Uterus

Measurements: 7.4 x 3.1 x 4.4 cm. Uterus is anteverted. 1.6 cm
diameter nabothian cyst in the cervix. No myometrial masses.

Endometrium

Thickness: 2.5 mm.  No focal abnormality visualized.

Right ovary

Measurements: 3.8 x 2.9 x 3.1 cm. There is a benign-appearing
paraovarian cyst at the right ovary measuring 2 cm maximal diameter.
No solid adnexal masses.

Left ovary

Measurements: 3.7 x 1.9 x 2.4 cm. Normal appearance/no adnexal mass.

Pulsed Doppler evaluation of both ovaries demonstrates normal
low-resistance arterial and venous waveforms.

Other findings

No abnormal free fluid.
IMPRESSION: Normal appearance of the uterus. Large nabothian cysts in the
cervix. No evidence of ovarian mass or torsion. Benign-appearing
paraovarian cyst along the right ovary.
